# Patient Record
Sex: Female | Born: 2000 | Hispanic: Yes | Marital: Single | Smoking: Never smoker
Health system: Southern US, Community
[De-identification: ages and names within clinical notes are randomized; demographics above are authoritative.]

## PROBLEM LIST (undated history)

## (undated) DIAGNOSIS — D649 Anemia, unspecified: Secondary | ICD-10-CM

## (undated) DIAGNOSIS — E308 Other disorders of puberty: Secondary | ICD-10-CM

## (undated) HISTORY — DX: Other disorders of puberty: E30.8

## (undated) HISTORY — DX: Anemia, unspecified: D64.9

---
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
## 2021-01-31 ENCOUNTER — Ambulatory Visit (INDEPENDENT_AMBULATORY_CARE_PROVIDER_SITE_OTHER): Payer: Medicaid Other | Admitting: Pediatrics

## 2021-01-31 ENCOUNTER — Encounter: Payer: Self-pay | Admitting: Pediatrics

## 2021-01-31 ENCOUNTER — Other Ambulatory Visit: Payer: Self-pay

## 2021-01-31 VITALS — Wt 106.5 lb

## 2021-01-31 DIAGNOSIS — R253 Fasciculation: Secondary | ICD-10-CM

## 2021-01-31 DIAGNOSIS — M545 Low back pain, unspecified: Secondary | ICD-10-CM

## 2021-01-31 DIAGNOSIS — R42 Dizziness and giddiness: Secondary | ICD-10-CM | POA: Diagnosis not present

## 2021-01-31 DIAGNOSIS — R5382 Chronic fatigue, unspecified: Secondary | ICD-10-CM

## 2021-01-31 LAB — POCT URINALYSIS DIPSTICK
Bilirubin, UA: NEGATIVE
Blood, UA: 250
Glucose, UA: NEGATIVE
Ketones, UA: NEGATIVE
Nitrite, UA: NEGATIVE
Protein, UA: NEGATIVE
Spec Grav, UA: 1.01 (ref 1.010–1.025)
Urobilinogen, UA: NEGATIVE E.U./dL — AB
pH, UA: 8 — AB (ref 5.0–8.0)

## 2021-01-31 NOTE — Progress Notes (Signed)
Subjective:     History was provided by the patient. Miranda Steele is a 20 y.o. female here for evaluation of the following concerns that started approximately 3 weeks ago: 1) very dehydrated, "no matter how much I drink, I'm still thirsty" 2) very tired all the time 3) will wake up at 0500 feeling very hot 4) hands and muscles will twitch 5) increased headache frequency over the past few weeks  -remembers 3, two of which were mild  -3rd headache was "intense"- vision changes, felt weak, nauseated  6) gets lightheaded 7) has had no appetite- will eat a few snacks 8) intermittent right sided back pain and right upper abdominal pain  No fevers. No vomiting or diarrhea. Intermittent sore throat. There has been a 3lb weight loss since last office visit (02/29/2020). Mother has diabetes and is concerned symptoms are related to diabetes.   Common ambulatory SmartLinks:[id number]}  Review of Systems Pertinent items are noted in HPI   Objective:    Wt 106 lb 8 oz (48.3 kg)  General:   alert, cooperative, appears stated age, fatigued and no distress  HEENT:   right and left TM normal without fluid or infection, neck without nodes, throat normal without erythema or exudate, airway not compromised and no evidence of ectopic thyroid  Neck:  no adenopathy, no carotid bruit, no JVD, supple, symmetrical, trachea midline and thyroid not enlarged, symmetric, no tenderness/mass/nodules.  Lungs:  clear to auscultation bilaterally  Heart:  regular rate and rhythm, S1, S2 normal, no murmur, click, rub or gallop  Abdomen:   normal findings: bowel sounds normal and soft and mild tenderness in right upper quadrant without organomegaly, NO rebound tenderness  Skin:   reveals no rash     Extremities:   extremities normal, atraumatic, no cyanosis or edema     Neurological:  alert, oriented x 3, no defects noted in general exam.    Results for orders placed or performed in visit on 01/31/21 (from the past  24 hour(s))  POCT urinalysis dipstick     Status: Abnormal   Collection Time: 01/31/21 11:09 AM  Result Value Ref Range   Color, UA amber    Clarity, UA clear    Glucose, UA Negative Negative   Bilirubin, UA negative    Ketones, UA negative    Spec Grav, UA 1.010 1.010 - 1.025   Blood, UA 250    pH, UA 8.0 (A) 5.0 - 8.0   Protein, UA Negative Negative   Urobilinogen, UA negative (A) 0.2 or 1.0 E.U./dL   Nitrite, UA negative    Leukocytes, UA Trace (A) Negative   Appearance     Odor      Assessment:   Fatigue Dizzy spells Right sided back and abdominal pain Muscle twitching  Plan:   Blood in urine contributed to Terrisha currently on her period, otherwise urine WNL UCX pending, will call if culture positive and start antibiotics Labs per orders (CBC, CMET, ESR, Vitamin D, TSH, Free T4, EBV, Hgb A1C) Will call patient with lab results [phone]  20 minutes spent in direct face to face time with patient discussing HPI, labs, and follow up plan.  

## 2021-01-31 NOTE — Patient Instructions (Signed)
Urine looks good in the office, will send culture to lab Blood work ordered, will call with results Continue to drink plenty of fluids, eat small snacks throughout the day Try to get at least 8 hours of sleep as much as possible Turn screens (TV, cell phone, tablets, laptops) off an hour before bed as much as possible

## 2021-02-01 ENCOUNTER — Telehealth: Payer: Self-pay | Admitting: Pediatrics

## 2021-02-01 DIAGNOSIS — R253 Fasciculation: Secondary | ICD-10-CM | POA: Diagnosis not present

## 2021-02-01 DIAGNOSIS — R42 Dizziness and giddiness: Secondary | ICD-10-CM | POA: Diagnosis not present

## 2021-02-01 DIAGNOSIS — R5382 Chronic fatigue, unspecified: Secondary | ICD-10-CM | POA: Diagnosis not present

## 2021-02-01 LAB — HEMOGLOBIN A1C
Hgb A1c MFr Bld: 5 % of total Hgb (ref <5.7)
Mean Plasma Glucose: 97 mg/dL
eAG (mmol/L): 5.4 mmol/L

## 2021-02-01 LAB — CBC WITH DIFFERENTIAL/PLATELET
Absolute Monocytes: 462 cells/uL (ref 200–900)
Basophils Absolute: 27 cells/uL (ref 0–200)
Basophils Relative: 0.4 %
Eosinophils Absolute: 61 cells/uL (ref 15–500)
Eosinophils Relative: 0.9 %
HCT: 41.5 % (ref 34.0–46.0)
Hemoglobin: 13.9 g/dL (ref 11.5–15.3)
Lymphs Abs: 1870 cells/uL (ref 1200–5200)
MCH: 31 pg (ref 25.0–35.0)
MCHC: 33.5 g/dL (ref 31.0–36.0)
MCV: 92.4 fL (ref 78.0–98.0)
MPV: 9.2 fL (ref 7.5–12.5)
Monocytes Relative: 6.8 %
Neutro Abs: 4379 cells/uL (ref 1800–8000)
Neutrophils Relative %: 64.4 %
Platelets: 306 10*3/uL (ref 140–400)
RBC: 4.49 10*6/uL (ref 3.80–5.10)
RDW: 12.1 % (ref 11.0–15.0)
Total Lymphocyte: 27.5 %
WBC: 6.8 10*3/uL (ref 4.5–13.0)

## 2021-02-01 LAB — COMPREHENSIVE METABOLIC PANEL
AG Ratio: 1.5 (calc) (ref 1.0–2.5)
ALT: 12 U/L (ref 5–32)
AST: 13 U/L (ref 12–32)
Albumin: 4.4 g/dL (ref 3.6–5.1)
Alkaline phosphatase (APISO): 55 U/L (ref 36–128)
BUN: 14 mg/dL (ref 7–20)
CO2: 24 mmol/L (ref 20–32)
Calcium: 9.5 mg/dL (ref 8.9–10.4)
Chloride: 105 mmol/L (ref 98–110)
Creat: 0.79 mg/dL (ref 0.50–1.00)
Globulin: 2.9 g/dL (calc) (ref 2.0–3.8)
Glucose, Bld: 98 mg/dL (ref 65–139)
Potassium: 4 mmol/L (ref 3.8–5.1)
Sodium: 139 mmol/L (ref 135–146)
Total Bilirubin: 0.5 mg/dL (ref 0.2–1.1)
Total Protein: 7.3 g/dL (ref 6.3–8.2)

## 2021-02-01 LAB — URINE CULTURE
MICRO NUMBER:: 11792420
Result:: NO GROWTH
SPECIMEN QUALITY:: ADEQUATE

## 2021-02-01 LAB — SEDIMENTATION RATE: Sed Rate: 6 mm/h (ref 0–20)

## 2021-02-01 LAB — TSH+FREE T4: TSH W/REFLEX TO FT4: 0.89 mIU/L

## 2021-02-01 LAB — EPSTEIN-BARR VIRUS EARLY D ANTIGEN ANTIBODY, IGG: EBV EA IgG: 12 U/mL — ABNORMAL HIGH

## 2021-02-01 LAB — EPSTEIN-BARR VIRUS VCA, IGM: EBV VCA IgM: <36 U/mL

## 2021-02-01 LAB — VITAMIN D 25 HYDROXY (VIT D DEFICIENCY, FRACTURES): Vit D, 25-Hydroxy: 21 ng/mL — ABNORMAL LOW (ref 30–100)

## 2021-02-01 LAB — EPSTEIN-BARR VIRUS NUCLEAR ANTIGEN ANTIBODY, IGG: EBV NA IgG: 31 U/mL — ABNORMAL HIGH

## 2021-02-01 LAB — EPSTEIN-BARR VIRUS VCA, IGG: EBV VCA IgG: >750 U/mL — ABNORMAL HIGH

## 2021-02-01 MED ORDER — VITAMIN D (ERGOCALCIFEROL) 50 MCG (2000 UT) PO CAPS: 1.0000 | ORAL_CAPSULE | Freq: Every day | ORAL | 6 refills | Status: DC

## 2021-02-01 NOTE — Telephone Encounter (Signed)
Reviewed lab results with mother. Vitamin D levels are low, will start Verlon Au on daily vitamin D supplement. Mom confirmed pharmacy, verbalized understanding and agreement.

## 2021-02-02 LAB — URINE CULTURE
MICRO NUMBER:: 11798909
SPECIMEN QUALITY:: ADEQUATE

## 2021-04-11 DIAGNOSIS — Z30013 Encounter for initial prescription of injectable contraceptive: Secondary | ICD-10-CM | POA: Diagnosis not present

## 2021-04-11 DIAGNOSIS — Z3202 Encounter for pregnancy test, result negative: Secondary | ICD-10-CM | POA: Diagnosis not present

## 2021-06-20 DIAGNOSIS — Z3042 Encounter for surveillance of injectable contraceptive: Secondary | ICD-10-CM | POA: Diagnosis not present

## 2021-08-29 DIAGNOSIS — Z3042 Encounter for surveillance of injectable contraceptive: Secondary | ICD-10-CM | POA: Diagnosis not present

## 2021-08-30 ENCOUNTER — Other Ambulatory Visit: Payer: Self-pay

## 2021-08-30 ENCOUNTER — Ambulatory Visit (INDEPENDENT_AMBULATORY_CARE_PROVIDER_SITE_OTHER): Payer: Medicaid Other | Admitting: Pediatrics

## 2021-08-30 VITALS — Wt 106.3 lb

## 2021-08-30 DIAGNOSIS — N3001 Acute cystitis with hematuria: Secondary | ICD-10-CM | POA: Diagnosis not present

## 2021-08-30 DIAGNOSIS — R300 Dysuria: Secondary | ICD-10-CM | POA: Diagnosis not present

## 2021-08-30 LAB — POCT URINALYSIS DIPSTICK
Bilirubin, UA: NEGATIVE
Blood, UA: NEGATIVE
Glucose, UA: NEGATIVE
Ketones, UA: NEGATIVE
Leukocytes, UA: NEGATIVE
Nitrite, UA: NEGATIVE
Protein, UA: NEGATIVE
Spec Grav, UA: 1.02 (ref 1.010–1.025)
Urobilinogen, UA: NEGATIVE E.U./dL — AB
pH, UA: 7 (ref 5.0–8.0)

## 2021-08-30 MED ORDER — CEPHALEXIN 500 MG PO CAPS: 500.0000 mg | ORAL_CAPSULE | Freq: Two times a day (BID) | ORAL | 0 refills | Status: AC

## 2021-08-30 MED ORDER — POLYETHYLENE GLYCOL 3350 17 G PO PACK: 17.0000 g | PACK | Freq: Every day | ORAL | 3 refills | Status: AC

## 2021-08-31 LAB — URINE CULTURE
MICRO NUMBER:: 12651796
SPECIMEN QUALITY:: ADEQUATE

## 2021-09-02 ENCOUNTER — Encounter: Payer: Self-pay | Admitting: Pediatrics

## 2021-09-02 DIAGNOSIS — N3001 Acute cystitis with hematuria: Secondary | ICD-10-CM | POA: Insufficient documentation

## 2021-09-02 DIAGNOSIS — R300 Dysuria: Secondary | ICD-10-CM | POA: Insufficient documentation

## 2021-09-02 NOTE — Progress Notes (Signed)
  Subjective:   20 yo female who complains of abnormal smelling urine, burning with urination and frequency. She has had symptoms for 2 days. Patient also complains of fever and sorethroat. Patient denies cough.   The following portions of the patient's history were reviewed and updated as appropriate: allergies, current medications, past family history, past medical history, past social history, past surgical history and problem list.  Review of Systems Pertinent items are noted in HPI.    Objective:    General appearance: cooperative Ears: normal TM's and external ear canals both ears Nose: Nares normal. Septum midline. Mucosa normal. No drainage or sinus tenderness. Throat: lips, mucosa, and tongue normal; teeth and gums normal Lungs: clear to auscultation bilaterally Heart: regular rate and rhythm, S1, S2 normal, no murmur, click, rub or gallop Abdomen: soft, non-tender; bowel sounds normal; no masses,  no organomegaly Skin: normal   Laboratory:  Urine dipstick: sp gravity 1020, negative for glucose, 1+ for hemoglobin, negative for ketones, 2+ for leukocyte esterase, 1+ for nitrites, trace for protein and trace for urobilinogen.   Micro exam: not done.    Assessment:    UTI  likely   Plan:    Medications: sent  Maintain adequate hydration. Follow up if symptoms not improving, and as needed.

## 2021-09-02 NOTE — Patient Instructions (Signed)
Urinary Tract Infection, Adult A urinary tract infection (UTI) is an infection of any part of the urinary tract. The urinary tract includes the kidneys, ureters, bladder, and urethra. These organs make, store, and get rid of urine in the body. An upper UTI affects the ureters and kidneys. A lower UTI affects the bladder and urethra. What are the causes? Most urinary tract infections are caused by bacteria in your genital area around your urethra, where urine leaves your body. These bacteria grow and cause inflammation of your urinary tract. What increases the risk? You are more likely to develop this condition if: You have a urinary catheter that stays in place. You are not able to control when you urinate or have a bowel movement (incontinence). You are female and you: Use a spermicide or diaphragm for birth control. Have low estrogen levels. Are pregnant. You have certain genes that increase your risk. You are sexually active. You take antibiotic medicines. You have a condition that causes your flow of urine to slow down, such as: An enlarged prostate, if you are female. Blockage in your urethra. A kidney stone. A nerve condition that affects your bladder control (neurogenic bladder). Not getting enough to drink, or not urinating often. You have certain medical conditions, such as: Diabetes. A weak disease-fighting system (immunesystem). Sickle cell disease. Gout. Spinal cord injury. What are the signs or symptoms? Symptoms of this condition include: Needing to urinate right away (urgency). Frequent urination. This may include small amounts of urine each time you urinate. Pain or burning with urination. Blood in the urine. Urine that smells bad or unusual. Trouble urinating. Cloudy urine. Vaginal discharge, if you are female. Pain in the abdomen or the lower back. You may also have: Vomiting or a decreased appetite. Confusion. Irritability or tiredness. A fever or  chills. Diarrhea. The first symptom in older adults may be confusion. In some cases, they may not have any symptoms until the infection has worsened. How is this diagnosed? This condition is diagnosed based on your medical history and a physical exam. You may also have other tests, including: Urine tests. Blood tests. Tests for STIs (sexually transmitted infections). If you have had more than one UTI, a cystoscopy or imaging studies may be done to determine the cause of the infections. How is this treated? Treatment for this condition includes: Antibiotic medicine. Over-the-counter medicines to treat discomfort. Drinking enough water to stay hydrated. If you have frequent infections or have other conditions such as a kidney stone, you may need to see a health care provider who specializes in the urinary tract (urologist). In rare cases, urinary tract infections can cause sepsis. Sepsis is a life-threatening condition that occurs when the body responds to an infection. Sepsis is treated in the hospital with IV antibiotics, fluids, and other medicines. Follow these instructions at home: Medicines Take over-the-counter and prescription medicines only as told by your health care provider. If you were prescribed an antibiotic medicine, take it as told by your health care provider. Do not stop using the antibiotic even if you start to feel better. General instructions Make sure you: Empty your bladder often and completely. Do not hold urine for long periods of time. Empty your bladder after sex. Wipe from front to back after urinating or having a bowel movement if you are female. Use each tissue only one time when you wipe. Drink enough fluid to keep your urine pale yellow. Keep all follow-up visits. This is important. Contact a health care provider   if: Your symptoms do not get better after 1-2 days. Your symptoms go away and then return. Get help right away if: You have severe pain in your  back or your lower abdomen. You have a fever or chills. You have nausea or vomiting. Summary A urinary tract infection (UTI) is an infection of any part of the urinary tract, which includes the kidneys, ureters, bladder, and urethra. Most urinary tract infections are caused by bacteria in your genital area. Treatment for this condition often includes antibiotic medicines. If you were prescribed an antibiotic medicine, take it as told by your health care provider. Do not stop using the antibiotic even if you start to feel better. Keep all follow-up visits. This is important. This information is not intended to replace advice given to you by your health care provider. Make sure you discuss any questions you have with your health care provider. Document Revised: 05/12/2020 Document Reviewed: 05/12/2020 Elsevier Patient Education  2022 Elsevier Inc.  

## 2021-11-13 DIAGNOSIS — Z3042 Encounter for surveillance of injectable contraceptive: Secondary | ICD-10-CM | POA: Diagnosis not present

## 2022-01-22 DIAGNOSIS — R350 Frequency of micturition: Secondary | ICD-10-CM | POA: Diagnosis not present

## 2022-01-22 DIAGNOSIS — Z3042 Encounter for surveillance of injectable contraceptive: Secondary | ICD-10-CM | POA: Diagnosis not present

## 2022-01-22 DIAGNOSIS — Z113 Encounter for screening for infections with a predominantly sexual mode of transmission: Secondary | ICD-10-CM | POA: Diagnosis not present

## 2022-04-01 ENCOUNTER — Telehealth: Payer: Self-pay | Admitting: Pediatrics

## 2022-04-01 NOTE — Telephone Encounter (Signed)
Immunization form put in Dr.Ram's office for completion.   Will e-mail back when completed.

## 2022-04-02 DIAGNOSIS — Z3042 Encounter for surveillance of injectable contraceptive: Secondary | ICD-10-CM | POA: Diagnosis not present

## 2022-04-03 NOTE — Telephone Encounter (Signed)
Child medical report filled  

## 2022-05-27 ENCOUNTER — Encounter: Payer: Self-pay | Admitting: Pediatrics

## 2022-06-14 DIAGNOSIS — Z3042 Encounter for surveillance of injectable contraceptive: Secondary | ICD-10-CM | POA: Diagnosis not present

## 2022-08-30 ENCOUNTER — Ambulatory Visit (INDEPENDENT_AMBULATORY_CARE_PROVIDER_SITE_OTHER): Payer: Medicaid Other | Admitting: Radiology

## 2022-08-30 ENCOUNTER — Encounter: Payer: Self-pay | Admitting: Radiology

## 2022-08-30 VITALS — BP 110/62 | Ht 62.5 in | Wt 112.0 lb

## 2022-08-30 DIAGNOSIS — Z01419 Encounter for gynecological examination (general) (routine) without abnormal findings: Secondary | ICD-10-CM

## 2022-08-30 DIAGNOSIS — Z23 Encounter for immunization: Secondary | ICD-10-CM | POA: Diagnosis not present

## 2022-08-30 DIAGNOSIS — Z308 Encounter for other contraceptive management: Secondary | ICD-10-CM

## 2022-08-30 DIAGNOSIS — N760 Acute vaginitis: Secondary | ICD-10-CM

## 2022-08-30 NOTE — Progress Notes (Signed)
   Miranda Steele 2001/02/21 [id number]   History:  21 y.o. G0 presents for annual exam as a new patient. Was on depo, due last week for injection, would like a LARC instead. C/o increased vaginal discharge.  Gynecologic History No LMP recorded. Patient has had an injection. Period Pattern: (!) Irregular Menstrual Flow: Heavy (heavy 1st day) Menstrual Control: Maxi pad Dysmenorrhea: (!) Moderate Dysmenorrhea Symptoms: Cramping Contraception/Family planning: condoms and Depo-Provera injections Sexually active: yes   Obstetric History OB History  Gravida Para Term Preterm AB Living  0 0 0 0 0 0  SAB IAB Ectopic Multiple Live Births  0 0 0 0 0     The following portions of the patient's history were reviewed and updated as appropriate: allergies, current medications, past family history, past medical history, past social history, past surgical history, and problem list.  Review of Systems Pertinent items noted in HPI and remainder of comprehensive ROS otherwise negative.   Past medical history, past surgical history, family history and social history were all reviewed and documented in the EPIC chart.   Exam:  Vitals:   08/30/22 1337  BP: 110/62  Weight: 112 lb (50.8 kg)  Height: 5' 2.5" (1.588 m)   Body mass index is 20.16 kg/m.  General appearance:  Normal Thyroid:  Symmetrical, normal in size, without palpable masses or nodularity. Respiratory  Auscultation:  Clear without wheezing or rhonchi Cardiovascular  Auscultation:  Regular rate, without rubs, murmurs or gallops  Edema/varicosities:  Not grossly evident Abdominal  Soft,nontender, without masses, guarding or rebound.  Liver/spleen:  No organomegaly noted  Hernia:  None appreciated  Skin  Inspection:  Grossly normal Breasts: Examined lying and sitting.   Right: Without masses, retractions, nipple discharge or axillary adenopathy.   Left: Without masses, retractions, nipple discharge or axillary  adenopathy. Genitourinary   Inguinal/mons:  Normal without inguinal adenopathy  External genitalia:  Normal appearing vulva with no masses, tenderness, or lesions  BUS/Urethra/Skene's glands:  Normal without masses or exudate  Vagina:  Normal appearing with normal color and discharge, no lesions  Cervix:  Normal appearing without discharge or lesions  Uterus:  Normal in size, shape and contour.  Mobile, nontender  Adnexa/parametria:     Rt: Normal in size, without masses or tenderness.   Lt: Normal in size, without masses or tenderness.  Anus and perineum: Normal   Patient informed chaperone available to be present for breast and pelvic exam. Patient has requested no chaperone to be present. Patient has been advised what will be completed during breast and pelvic exam.   Assessment/Plan:   1. Well woman exam with routine gynecological exam Pap at 22  2. Encounter for other contraceptive management Desires Nexplanon No unprotected sex until insertion - Remove and insert drug implant; Future  3. Acute vaginitis - SureSwab Advanced Vaginitis Plus,TMA  4. Need for immunization against influenza - Flu Vaccine QUAD 56mo+IM (Fluarix, Fluzone & Alfiuria Quad PF)    Discussed SBE, colonoscopy and DEXA screening as directed/appropriate. Recommend of exercise weekly, including weight bearing exercise. Encouraged the use of seatbelts and sunscreen. Return in 1 year for annual or as needed.   Brave Dack B WHNP-BC 2:10 PM 08/30/2022

## 2022-08-31 LAB — SURESWAB® ADVANCED VAGINITIS PLUS,TMA
C. trachomatis RNA, TMA: NOT DETECTED
CANDIDA SPECIES: NOT DETECTED
Candida glabrata: NOT DETECTED
N. gonorrhoeae RNA, TMA: NOT DETECTED
SURESWAB(R) ADV BACTERIAL VAGINOSIS(BV),TMA: NEGATIVE
TRICHOMONAS VAGINALIS (TV),TMA: NOT DETECTED

## 2022-09-02 ENCOUNTER — Encounter: Payer: Medicaid Other | Admitting: Radiology

## 2022-09-03 ENCOUNTER — Other Ambulatory Visit: Payer: Self-pay

## 2022-09-03 DIAGNOSIS — Z308 Encounter for other contraceptive management: Secondary | ICD-10-CM

## 2022-09-10 ENCOUNTER — Ambulatory Visit (INDEPENDENT_AMBULATORY_CARE_PROVIDER_SITE_OTHER): Payer: Medicaid Other | Admitting: Radiology

## 2022-09-10 ENCOUNTER — Encounter: Payer: Self-pay | Admitting: Radiology

## 2022-09-10 VITALS — BP 108/62

## 2022-09-10 DIAGNOSIS — Z01812 Encounter for preprocedural laboratory examination: Secondary | ICD-10-CM

## 2022-09-10 DIAGNOSIS — Z308 Encounter for other contraceptive management: Secondary | ICD-10-CM

## 2022-09-10 DIAGNOSIS — Z30017 Encounter for initial prescription of implantable subdermal contraceptive: Secondary | ICD-10-CM

## 2022-09-10 LAB — PREGNANCY, URINE: Preg Test, Ur: NEGATIVE

## 2022-09-10 NOTE — Progress Notes (Signed)
     21 y.o. G0P0000 female presents for Nexplanon insertion.  She has been counseled about alternative types of contraception and has decided this long acting method is the best for her.  Procedure, risks and benefits have all been explained.    Pregnancy test: negative  After all questions were answered, consent was obtained.    Past Medical History:  Diagnosis Date   Anemia    [place], premature     No past surgical history on file.  Current Outpatient Medications on File Prior to Visit  Medication Sig Dispense Refill   naproxen (NAPROSYN) 500 MG tablet TAKE 1 TABLET BY MOUTH TWICE DAILY AS NEEDED 60 tablet 0   No current facility-administered medications on file prior to visit.   No Known Allergies  Vitals:   09/10/22 0926  BP: 108/62   Physical Exam  Procedure: Patient placed supine on exam table with her left arm flexed at the elbow. The location for insertion site was identified 8-10 cm from epicondyle and 3-5 cm posterior to the sulcus.  In sterile fashion the area was cleansed with Betadine x 3. Insertion site and path of insertion was anesthetized with 1% Lidocaine without epinephrine, 2 cc total.  Using Nexplanon device (and after confirming presence of rod in device), skin was pierced and then elevated along insertion path, passing device just under the skin.  Rod released and device inserted.  Rod palpated easily by myself and the patient.  Band aid and pressure bandage were applied over the site.   Chaperone, Raynelle Fanning, CMA was present for the entirety of the procedure  Assessment: Nexplanon insertion.  Plan:  Post procedure instructions reviewed with pt.  Questions answered.  Pt knows to call with any concerns or questions.  Pt is aware removal is due by 3 calendar years from today.

## 2022-10-25 ENCOUNTER — Ambulatory Visit (INDEPENDENT_AMBULATORY_CARE_PROVIDER_SITE_OTHER): Payer: Medicaid Other | Admitting: Pediatrics

## 2022-10-25 ENCOUNTER — Encounter: Payer: Self-pay | Admitting: Pediatrics

## 2022-10-25 VITALS — Temp 98.2°F | Wt 114.6 lb

## 2022-10-25 DIAGNOSIS — R5381 Other malaise: Secondary | ICD-10-CM | POA: Diagnosis not present

## 2022-10-25 DIAGNOSIS — R110 Nausea: Secondary | ICD-10-CM | POA: Diagnosis not present

## 2022-10-25 DIAGNOSIS — R5383 Other fatigue: Secondary | ICD-10-CM

## 2022-10-25 DIAGNOSIS — G44219 Episodic tension-type headache, not intractable: Secondary | ICD-10-CM

## 2022-10-25 DIAGNOSIS — R300 Dysuria: Secondary | ICD-10-CM | POA: Diagnosis not present

## 2022-10-25 LAB — POCT URINALYSIS DIPSTICK
Bilirubin, UA: NORMAL
Blood, UA: NORMAL
Glucose, UA: POSITIVE — AB
Leukocytes, UA: NEGATIVE
Nitrite, UA: NORMAL
Protein, UA: POSITIVE — AB
Spec Grav, UA: 1.02 (ref 1.010–1.025)
Urobilinogen, UA: 4 U/dL — AB
pH, UA: 5 (ref 5.0–8.0)

## 2022-10-25 LAB — POCT INFLUENZA A: Rapid Influenza A Ag: NEGATIVE

## 2022-10-25 LAB — POCT INFLUENZA B: Rapid Influenza B Ag: NEGATIVE

## 2022-10-25 LAB — POCT GLUCOSE (DEVICE FOR HOME USE): POC Glucose: 102 mg/dl — AB (ref 70–99)

## 2022-10-25 LAB — POC SOFIA SARS ANTIGEN FIA: SARS Coronavirus 2 Ag: NEGATIVE

## 2022-10-25 MED ORDER — ONDANSETRON HCL 4 MG PO TABS: 4.0000 mg | ORAL_TABLET | Freq: Three times a day (TID) | ORAL | 0 refills | Status: AC | PRN

## 2022-10-25 MED ORDER — HYDROXYZINE HCL 10 MG PO TABS: 10.0000 mg | ORAL_TABLET | Freq: Three times a day (TID) | ORAL | 0 refills | Status: AC | PRN

## 2022-10-25 NOTE — Progress Notes (Unsigned)
Yesterday afternoon  Goes to [facility] a nap yesterday afternoon Started to feel very weak, shaky Woke up and felt worse Bad headache Extreme weakness Felt faint yesterday Intense headache Headache through the night Feels a little better Feels dehydrated Having cramping in the legs for the last 2 days Chills  Hands and feet cold No medication No blurry vision Does bruise easily Feeling like heart is racing Felt fatigued yesterday while walking Still taking iron and has taken Vitamin D before, but not currently  Some burning with urination  No low back pain Has Nexplanon- doesn't have cycle very often  Just inserted it last month Was on last month Diarrhea in the last day, no vomiting  Subjective:      History was provided by the {relatives:19415}.  Miranda Steele is a 22 y.o. female here for chief complaint of *** .  {Common ambulatory SmartLinks:19316}  Review of Systems All pertinent information noted in the HPI.  Objective:  Temp 98.2 F (36.8 C)   Wt 114 lb 9.6 oz (52 kg)   BMI 20.63 kg/m  General:   {appearance:16600}  Oropharynx:  {oropharynx brief exam:17160::"lips, mucosa, and tongue normal; teeth and gums normal"}   Eyes:   {eyes:201::"conjunctivae/corneas clear. PERRL, EOM's intact. Fundi benign."}   Ears:   {ears:5207::"normal TM's and external ear canals both ears"}  Neck:  {neck:17463::"no adenopathy","no carotid bruit","no JVD","supple, symmetrical, trachea midline","thyroid not enlarged, symmetric, no tenderness/mass/nodules"}  Thyroid:   {nodule:16229}  Lung:  {lung exam:16931}  Heart:   {heart:5510}  Abdomen:  {abdomen exam:16834}  Extremities:  {extremity:5109}  Skin:  {skin:17778::"warm and dry, no hyperpigmentation, vitiligo, or suspicious lesions"}  Neurological:   {neuro exam:16942}  Psychiatric:   {psych:16943::"normal mood, behavior, speech, dress, and thought processes"}   Results for orders placed or performed in  visit on 10/25/22 (from the past 24 hour(s))  POCT Influenza A     Status: Normal   Collection Time: 10/25/22 10:04 AM  Result Value Ref Range   Rapid Influenza A Ag Negative   POCT Influenza B     Status: Normal   Collection Time: 10/25/22 10:05 AM  Result Value Ref Range   Rapid Influenza B Ag Negative   POC SOFIA Antigen FIA     Status: Normal   Collection Time: 10/25/22 10:05 AM  Result Value Ref Range   SARS Coronavirus 2 Ag Negative Negative  POCT Glucose (Device for Home Use)     Status: Abnormal   Collection Time: 10/25/22 10:33 AM  Result Value Ref Range   Glucose Fasting, POC     POC Glucose 102 (A) 70 - 99 mg/dl  POCT urinalysis dipstick     Status: Abnormal   Collection Time: 10/25/22 10:33 AM  Result Value Ref Range   Color, UA Yellow    Clarity, UA Clear    Glucose, UA Positive (A) Negative   Bilirubin, UA Normal    Ketones, UA Normail    Spec Grav, UA 1.020 1.010 - 1.025   Blood, UA Normal    pH, UA 5.0 5.0 - 8.0   Protein, UA Positive (A) Negative   Urobilinogen, UA 4.0 (A) 0.2 or 1.0 E.U./dL   Nitrite, UA Normal    Leukocytes, UA Negative Negative   Appearance Clear    Odor None    Assessment:     Plan:  Normal progression of disease discussed. All questions answered. Explained the rationale for symptomatic treatment rather than use of an antibiotic. Instruction provided in  the use of fluids, vaporizer, acetaminophen, and other OTC medication for symptom control. Extra fluids Analgesics as needed, dose reviewed. Follow up as needed should symptoms fail to improve.   -Return precautions discussed. No follow-ups on file.  Meds ordered this encounter  Medications   hydrOXYzine (ATARAX) 10 MG tablet    Sig: Take 1 tablet (10 mg total) by mouth every 8 (eight) hours as needed for up to 5 days.    Dispense:  15 tablet    Refill:  0    Order Specific Question:   Supervising Provider    Answer:   [name] [[id number]]   ondansetron (ZOFRAN) 4 MG  tablet    Sig: Take 1 tablet (4 mg total) by mouth every 8 (eight) hours as needed for up to 5 days for nausea or vomiting.    Dispense:  15 tablet    Refill:  0    Order Specific Question:   Supervising Provider    Answer:   [name] [[id number]]     Arville Care, NP  10/25/22

## 2022-10-25 NOTE — Patient Instructions (Addendum)
Migraine "cocktail" to help with the headache and nausea: 1 Tablet Hydroxyzine  1 tablet Zofran 1 dose of Tylenol/Motrin/Naproxen   Take all 3 meds together. Increase fluid intake  Fatiga Fatigue Si siente fatiga, tiene una sensacin de cansancio en todo momento y falta de energa o falta de motivacin. La fatiga puede dificultar el inicio o la realizacin de tareas debido al agotamiento. La fatiga ocasional o leve con frecuencia es una reaccin normal a la actividad o la vida. Sin embargo, la fatiga de Engineer, site duracin (crnica) o extrema puede ser sntoma de una afeccin mdica, por ejemplo: Depresin. No tener suficientes glbulos rojos o hemoglobina en la sangre (anemia). Un problema con Ardelia Mems glndula pequea ubicada en la parte inferior delantera del cuello (trastorno tiroideo). Afecciones reumatolgicas. Estos son problemas relacionados con el sistema de defensa del cuerpo (sistema inmunitario). Infecciones, especialmente ciertas infecciones virales. Con el transcurso del [place], la fatiga tambin puede tener consecuencias negativas en la salud. Siga estas instrucciones en su casa: Medicamentos Use los medicamentos de venta libre y los recetados solamente como se lo haya indicado el mdico. Tome una multivitamina, si se lo indic el mdico. No tome ningn complemento alimenticio o a base de hierbas a menos que lo haya autorizado el mdico. Comida y bebida  Evite las comidas abundantes a la noche. Consuma una dieta bien balanceada que incluya protenas magras, cereales integrales, abundantes frutas, verduras y productos lcteos descremados. Evite comer o beber demasiados productos que contengan cafena. Evite tomar alcohol. Beber suficiente lquido como para Theatre manager la orina de color amarillo plido. Actividad  Realice ejercicio con regularidad como se lo haya indicado el mdico. Practique tcnicas que lo ayuden a Nurse, children's, como yoga, tai chi, meditacin, masoterapia. [place] situaciones que le provocan estrs. Trate de que sus horarios de [place] y personal estn equilibrados. No use drogas ilegales ni recreativas. Instrucciones generales Controle su fatiga para ver si hay cambios. Acustese y levntese a la misma hora todos [place]. Evite la fatiga moderando su ritmo [place] y durmiendo lo suficiente durante la noche. Mantenga un peso saludable. Comunquese con un mdico si: La fatiga no mejora. Tiene fiebre. Pierde peso o [place] de peso de forma repentina. Tiene dolores de [place]. Tiene problemas para conciliar el sueo o para dormir en la noche. Se siente enojado, culpable, ansioso o triste. Observa hinchazn en las piernas u otras partes del cuerpo. Solicite ayuda de inmediato si: Se siente confundido, siente que va a desmayarse o se desmaya. Tiene la visin borrosa o tiene dolor de cabeza intenso. Siente dolor intenso en el abdomen, la espalda o el rea entre la cintura y las caderas (pelvis). Tiene dolor de pecho, dificultad para respirar, o latidos cardacos irregulares o acelerados. No puede orinar u [employer] de lo normal. Presenta sangrado anormal del recto, la nariz, los pulmones, los pezones o, si es [place], de la vagina. Vomita sangre. Tiene pensamientos acerca de Sport and exercise psychologist a Producer, television/film/video. Estos sntomas pueden Sales executive. Solicite ayuda de inmediato. Llame al 911. No espere a ver si los sntomas desaparecen. No conduzca por sus propios medios [employer]. [place] ayuda de inmediato si alguna vez siente que puede hacerse dao a usted mismo o a otros, o tiene pensamientos de Doctor, hospital a su vida. Dirjase al centro de urgencias ms cercano o: Llame al 911. Llame a National Suicide Prevention Lifeline ([place] para la Prevencin del Suicidio) al [phone] o al 988. Est  disponible las 24 horas del da. Enve un mensaje de texto a la lnea para casos de crisis al  [phone]. Resumen Si siente fatiga, tiene una sensacin de cansancio en todo momento y falta de energa o falta de motivacin. La fatiga puede dificultar el inicio o la realizacin de tareas debido al agotamiento. La fatiga de larga duracin (crnica) o extrema puede ser sntoma de una afeccin mdica. Realice ejercicio con regularidad como se lo haya indicado el mdico. Cambie las situaciones que le provocan estrs. Trate de que sus horarios de [place] y personal estn equilibrados. Esta informacin no tiene Marine scientist el consejo del mdico. Asegrese de hacerle al mdico cualquier pregunta que tenga. Document Revised: 08/09/2021 Document Reviewed: 08/09/2021 Elsevier Patient Education  [place].

## 2022-10-26 LAB — URINE CULTURE
MICRO NUMBER:: 14424644
Result:: NO GROWTH
SPECIMEN QUALITY:: ADEQUATE

## 2022-10-27 DIAGNOSIS — R5381 Other malaise: Secondary | ICD-10-CM | POA: Insufficient documentation

## 2022-10-27 DIAGNOSIS — G44219 Episodic tension-type headache, not intractable: Secondary | ICD-10-CM | POA: Insufficient documentation

## 2022-10-30 LAB — CBC WITH DIFFERENTIAL/PLATELET
Absolute Monocytes: 420 cells/uL (ref 200–950)
Basophils Absolute: 30 cells/uL (ref 0–200)
Basophils Relative: 0.5 %
Eosinophils Absolute: 30 cells/uL (ref 15–500)
Eosinophils Relative: 0.5 %
HCT: 43.7 % (ref 35.0–45.0)
Hemoglobin: 15.1 g/dL (ref 11.7–15.5)
Lymphs Abs: 2118 cells/uL (ref 850–3900)
MCH: 30.7 pg (ref 27.0–33.0)
MCHC: 34.6 g/dL (ref 32.0–36.0)
MCV: 88.8 fL (ref 80.0–100.0)
MPV: 9.6 fL (ref 7.5–12.5)
Monocytes Relative: 7 %
Neutro Abs: 3402 cells/uL (ref 1500–7800)
Neutrophils Relative %: 56.7 %
Platelets: 297 10*3/uL (ref 140–400)
RBC: 4.92 10*6/uL (ref 3.80–5.10)
RDW: 11.9 % (ref 11.0–15.0)
Total Lymphocyte: 35.3 %
WBC: 6 10*3/uL (ref 3.8–10.8)

## 2022-10-30 LAB — VITAMIN D 1,25 DIHYDROXY
Vitamin D 1, 25 (OH)2 Total: 40 pg/mL (ref 18–72)
Vitamin D2 1, 25 (OH)2: <8 pg/mL
Vitamin D3 1, 25 (OH)2: 40 pg/mL

## 2022-10-30 LAB — HEMOGLOBIN A1C
Hgb A1c MFr Bld: 5.1 % of total Hgb (ref <5.7)
Mean Plasma Glucose: 100 mg/dL
eAG (mmol/L): 5.5 mmol/L

## 2022-10-30 LAB — COMPREHENSIVE METABOLIC PANEL
AG Ratio: 1.5 (calc) (ref 1.0–2.5)
ALT: 16 U/L (ref 6–29)
AST: 14 U/L (ref 10–30)
Albumin: 4.7 g/dL (ref 3.6–5.1)
Alkaline phosphatase (APISO): 63 U/L (ref 31–125)
BUN: 15 mg/dL (ref 7–25)
CO2: 20 mmol/L (ref 20–32)
Calcium: 9.9 mg/dL (ref 8.6–10.2)
Chloride: 103 mmol/L (ref 98–110)
Creat: 0.82 mg/dL (ref 0.50–0.96)
Globulin: 3.2 g/dL (calc) (ref 1.9–3.7)
Glucose, Bld: 79 mg/dL (ref 65–99)
Potassium: 4 mmol/L (ref 3.5–5.3)
Sodium: 138 mmol/L (ref 135–146)
Total Bilirubin: 0.6 mg/dL (ref 0.2–1.2)
Total Protein: 7.9 g/dL (ref 6.1–8.1)

## 2022-10-30 LAB — EPSTEIN-BARR VIRUS NUCLEAR ANTIGEN ANTIBODY, IGG: EBV NA IgG: 358 U/mL — ABNORMAL HIGH

## 2022-10-30 LAB — TSH: TSH: 0.92 mIU/L

## 2022-10-30 LAB — EPSTEIN-BARR VIRUS EARLY D ANTIGEN ANTIBODY, IGG: EBV EA IgG: 15.2 U/mL — ABNORMAL HIGH (ref <9.00)

## 2022-10-30 LAB — EPSTEIN-BARR VIRUS VCA, IGM: EBV VCA IgM: <36 U/mL

## 2022-10-30 LAB — EPSTEIN-BARR VIRUS VCA, IGG: EBV VCA IgG: >750 U/mL — ABNORMAL HIGH

## 2022-10-30 LAB — T4, FREE: Free T4: 1.2 ng/dL (ref 0.8–1.4)

## 2023-06-24 ENCOUNTER — Encounter: Payer: Self-pay | Admitting: Pediatrics

## 2023-08-22 ENCOUNTER — Ambulatory Visit (INDEPENDENT_AMBULATORY_CARE_PROVIDER_SITE_OTHER): Payer: Medicaid Other | Admitting: Pediatrics

## 2023-08-22 VITALS — Temp 98.2°F | Wt 123.5 lb

## 2023-08-22 DIAGNOSIS — R300 Dysuria: Secondary | ICD-10-CM | POA: Diagnosis not present

## 2023-08-22 DIAGNOSIS — N760 Acute vaginitis: Secondary | ICD-10-CM

## 2023-08-22 LAB — POCT URINALYSIS DIPSTICK
Bilirubin, UA: NEGATIVE
Blood, UA: NEGATIVE
Glucose, UA: NEGATIVE
Ketones, UA: POSITIVE
Leukocytes, UA: NEGATIVE
Nitrite, UA: NEGATIVE
Protein, UA: POSITIVE — AB
Spec Grav, UA: <=1.005 — AB (ref 1.010–1.025)
Urobilinogen, UA: 0.2 U/dL
pH, UA: 6 (ref 5.0–8.0)

## 2023-08-22 MED ORDER — FLUCONAZOLE 150 MG PO TABS: 150.0000 mg | ORAL_TABLET | Freq: Every day | ORAL | 0 refills | Status: AC

## 2023-08-22 NOTE — Progress Notes (Signed)
  Subjective:    Zalena is a 22 y.o. old female here with her mother for Dysuria   HPI: Shinique presents with history of dysuria for about 1 week.  Student at [employer] but in back in town today.  She self reports a subjective fever yesterday evening.  She reports some discharge and itching.  Recently had her period.  History of UTI in past.  Reports some slight abd pain lower stomach.  Denies any chance for STD.  Denies any HA, sore throat, cold symptoms, body aches.      The following portions of the patient's history were reviewed and updated as appropriate: allergies, current medications, past family history, past medical history, past social history, past surgical history and problem list.  Review of Systems Pertinent items are noted in HPI.   Allergies: No Known Allergies   Current Outpatient Medications on File Prior to Visit  Medication Sig Dispense Refill   naproxen (NAPROSYN) 500 MG tablet TAKE 1 TABLET BY MOUTH TWICE DAILY AS NEEDED 60 tablet 0   No current facility-administered medications on file prior to visit.    History and Problem List: Past Medical History:  Diagnosis Date   Anemia    [place], premature         Objective:    Temp 98.2 F (36.8 C) (Temporal)   Wt 123 lb 8 oz (56 kg)   BMI 22.23 kg/m   General: alert, active, non toxic, age appropriate interaction Eye:  PERRL, EOMI, conjunctivae/sclera clear, no discharge Lungs: clear to auscultation, no wheeze, crackles or retractions, unlabored breathing Heart: RRR, Nl S1, S2, no murmurs Abd: soft, non tender, non distended, no rebound tenderness, normal BS, no organomegaly, no masses appreciated GU: deferred  Skin: no rashes Neuro: normal mental status, No focal deficits      Assessment:   Mariesha is a 22 y.o. old female with  1. Dysuria   2. Vulvovaginitis     Plan:   --UA with neg LE/Nit, trace pro.  Plan to send urine for confirmation culture and to contact back if intervention  needed.  --Denies sexual encounter but could consider testing if symptoms persist.  If urine does not show likely UTI recommend return to gynecologist for evaluation.     Meds ordered this encounter  Medications   fluconazole (DIFLUCAN) 150 MG tablet    Sig: Take 1 tablet (150 mg total) by mouth daily for 3 days.    Dispense:  3 tablet    Refill:  0    Return if symptoms worsen or fail to improve. in 2-3 days or prior for concerns  Myles Gip, DO

## 2023-08-22 NOTE — Patient Instructions (Signed)
Dysuria Dysuria is pain or discomfort during urination. The pain or discomfort may be felt in the part of the body that drains urine from the bladder (urethra) or in the surrounding tissue of the genitals. The pain may also be felt in the groin area, lower abdomen, or lower back. You may have to urinate frequently or have the sudden feeling that you have to urinate (urgency). Dysuria can affect anyone, but it is more common in females. Dysuria can be caused by many different things, including: Urinary tract infection. Kidney stones or bladder stones. Certain STIs (sexually transmitted infections), such as chlamydia. Dehydration. Inflammation of the tissues of the vagina. Use of certain medicines. Use of certain soaps or scented products that cause irritation. Follow these instructions at home: Medicines Take over-the-counter and prescription medicines only as told by your health care provider. If you were prescribed an antibiotic medicine, take it as told by your health care provider. Do not stop taking the antibiotic even if you start to feel better. Eating and drinking  Drink enough fluid to keep your urine pale yellow. Avoid caffeinated beverages, tea, and alcohol. These beverages can irritate the bladder and make dysuria worse. In males, alcohol may irritate the prostate. General instructions Watch your condition for any changes. Urinate often. Avoid holding urine for long periods of time. If you are female, you should wipe from front to back after urinating or having a bowel movement. Use each piece of toilet paper only once. Empty your bladder after sex. Keep all follow-up visits. This is important. If you had any tests done to find the cause of dysuria, it is up to you to get your test results. Ask your health care provider, or the department that is doing the test, when your results will be ready. Contact a health care provider if: You have a fever. You develop pain in your back or  sides. You have nausea or vomiting. You have blood in your urine. You are not urinating as often as you usually do. Get help right away if: Your pain is severe and not relieved with medicines. You cannot eat or drink without vomiting. You are confused. You have a rapid heartbeat while resting. You have shaking or chills. You feel extremely weak. Summary Dysuria is pain or discomfort while urinating. Many different conditions can lead to dysuria. If you have dysuria, you may have to urinate frequently or have the sudden feeling that you have to urinate (urgency). Watch your condition for any changes. Keep all follow-up visits. Make sure that you urinate often and drink enough fluid to keep your urine pale yellow. This information is not intended to replace advice given to you by your health care provider. Make sure you discuss any questions you have with your health care provider. Document Revised: 05/12/2020 Document Reviewed: 05/12/2020 Elsevier Patient Education  2024 Elsevier Inc.  

## 2023-08-23 LAB — URINE CULTURE
MICRO NUMBER:: 15705999
Result:: NO GROWTH
SPECIMEN QUALITY:: ADEQUATE

## 2023-08-31 ENCOUNTER — Encounter: Payer: Self-pay | Admitting: Pediatrics

## 2024-02-13 ENCOUNTER — Telehealth: Payer: Self-pay

## 2024-02-13 NOTE — Telephone Encounter (Signed)
 Enter in error

## 2024-03-03 ENCOUNTER — Telehealth: Admitting: Psychology

## 2024-03-03 DIAGNOSIS — F411 Generalized anxiety disorder: Secondary | ICD-10-CM | POA: Diagnosis not present

## 2024-03-10 DIAGNOSIS — F411 Generalized anxiety disorder: Secondary | ICD-10-CM | POA: Diagnosis not present

## 2024-03-18 ENCOUNTER — Telehealth (INDEPENDENT_AMBULATORY_CARE_PROVIDER_SITE_OTHER): Admitting: Psychology

## 2024-03-18 DIAGNOSIS — F411 Generalized anxiety disorder: Secondary | ICD-10-CM

## 2024-03-30 DIAGNOSIS — F411 Generalized anxiety disorder: Secondary | ICD-10-CM | POA: Diagnosis not present

## 2024-03-31 ENCOUNTER — Telehealth: Admitting: Psychology

## 2024-04-07 ENCOUNTER — Telehealth (INDEPENDENT_AMBULATORY_CARE_PROVIDER_SITE_OTHER): Admitting: Psychology

## 2024-04-07 DIAGNOSIS — F411 Generalized anxiety disorder: Secondary | ICD-10-CM | POA: Diagnosis not present

## 2024-04-15 DIAGNOSIS — F411 Generalized anxiety disorder: Secondary | ICD-10-CM | POA: Diagnosis not present

## 2024-04-20 ENCOUNTER — Telehealth: Admitting: Psychology

## 2024-04-21 ENCOUNTER — Telehealth (INDEPENDENT_AMBULATORY_CARE_PROVIDER_SITE_OTHER): Admitting: Psychology

## 2024-04-21 DIAGNOSIS — F411 Generalized anxiety disorder: Secondary | ICD-10-CM | POA: Diagnosis not present

## 2024-04-27 DIAGNOSIS — F411 Generalized anxiety disorder: Secondary | ICD-10-CM | POA: Diagnosis not present

## 2024-05-03 NOTE — Progress Notes (Signed)
 DAP note Client name: Miranda Steele  Therapist name: Camie Norris [name] Date: 03/02/2024 Time: 6:00 pm  Data: This was the first session with the client. She reported that she had wanted therapy for a while now because she found herself struggling with anxiety and felt on the verge of having panic attacks at times. She was very willing and open to share. She reported a lot about her present. She reports that she is in college, at an internship, travels often, is part of many clubs and organizations, and works. She states that she likes being busy but that this was also becoming the cause of a lot of stress, as she is realizing that there is a thing of being to busy.  Assessment: Client was overwhelmed, as evidence by how busy she is all the time. It was very observant that she was able to recognize that it was to much and that she needed to do something about it. We discussed the importance of rest and how we would work on her saying no. It was important for her to understand that this does not mean that she has failed. If she is unable to give her all to something then she would not feel good about that either and so finding a happy balance is of utmost importance.  Plan: Attend bi-weekly therapy, with a goal of attending 2 sessions per month. Client will take a moment of mindfulness daily and utilize box breathing as needed for anxious moments. Client will work on boundary setting and organization with a goal of reducing overall anxiety.

## 2024-05-05 ENCOUNTER — Telehealth: Admitting: Psychology
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
## 2024-05-11 DIAGNOSIS — F411 Generalized anxiety disorder: Secondary | ICD-10-CM | POA: Diagnosis not present

## 2024-05-12 ENCOUNTER — Other Ambulatory Visit: Admitting: Psychology

## 2024-05-12 ENCOUNTER — Telehealth (INDEPENDENT_AMBULATORY_CARE_PROVIDER_SITE_OTHER): Admitting: Psychology

## 2024-05-12 DIAGNOSIS — F411 Generalized anxiety disorder: Secondary | ICD-10-CM | POA: Diagnosis not present

## 2024-05-26 ENCOUNTER — Other Ambulatory Visit: Admitting: Psychology

## 2024-05-31 NOTE — Progress Notes (Signed)
 DAP note Client name: Miranda Steele  Therapist name: Camie Norris [name] Date: 04/07/2024 Time: 6:00 pm   Data: Client stated that she was doing well today. 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 She reports that this is the first time that she could remember saying no and while it was the right choice it had cause her a lot of anxiety.    Assessment: Client presented as calm today. While it was hard for her to say no, she knew this was for the best. It was going to be more stressful if she had stayed in it because her heart was not in it. While there was anxiety there, there was also empowerment that she was able to say no.    Plan: Attend bi-weekly therapy, with a goal of attending 2 sessions per month. Client will take a moment of mindfulness daily and utilize box breathing as needed for anxious moments. Client will work on boundary setting and organization with a goal of reducing overall anxiety.

## 2024-06-08 DIAGNOSIS — F411 Generalized anxiety disorder: Secondary | ICD-10-CM | POA: Diagnosis not present

## 2024-06-15 NOTE — Progress Notes (Signed)
 DAP note Client name: Miranda Steele  Therapist name: Miranda Steele Date: 04/21/2024 Time: 6:00 pm   Data: Client stated that she was doing well today. She reports that she is doing okay with backing out of the other club. She states that she knows that she had no choice because it was causing her so much anxiety even though she does feel bad. She reports that she does hope that saying will no will come easier as time goes on.    Assessment: Client presented as calm today. Today client was provided with worksheets to help with letting go of what cannot be controled and building confidence to say no. Client knows that she needs to get ahead of this because this causes her a lot of her anxiety and she does not want it to get worse. She is willing to put in the work to grow her confidence and enforce her boundaries.    Plan: Attend bi-weekly therapy, with a goal of attending 2 sessions per month. Client will take a moment of mindfulness daily and utilize box breathing as needed for anxious moments. Client will work on boundary setting and organization with a goal of reducing overall anxiety. Client will complete worksheets so that we can discuss next week.

## 2024-06-16 ENCOUNTER — Other Ambulatory Visit: Admitting: Psychology

## 2024-06-16 ENCOUNTER — Telehealth (INDEPENDENT_AMBULATORY_CARE_PROVIDER_SITE_OTHER): Admitting: Psychology

## 2024-06-16 DIAGNOSIS — F411 Generalized anxiety disorder: Secondary | ICD-10-CM | POA: Diagnosis not present

## 2024-06-24 DIAGNOSIS — F411 Generalized anxiety disorder: Secondary | ICD-10-CM | POA: Diagnosis not present

## 2024-06-29 NOTE — Progress Notes (Signed)
 DAP note Client name: Miranda Steele  Therapist name: Camie Norris [name] Date: 05/12/2024 Time: 6:00 pm   Data: Client stated that she was doing really good. 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 She also states that she has made some connections that might come in down the road. She reports that she is really excited because she is about to take a 2 week trip to [place] and [place] with a friend.     Assessment: Client presented with a sense of accomplishment. She has worked really hard this summer and she has learned a lot. She continues to put in the work on herself and therefore is continuing to grow. She also does a really good job of self care. This trip is an example of that, she deserves to treat herself because she is always working so hard.     Plan: Attend bi-weekly therapy, with a goal of attending 2 sessions per month. Client will take a moment of mindfulness daily and utilize box breathing as needed for anxious moments. Client will work on boundary setting and organization with a goal of reducing overall anxiety. Client will complete worksheets so that we can discuss next week.

## 2024-06-30 ENCOUNTER — Ambulatory Visit (INDEPENDENT_AMBULATORY_CARE_PROVIDER_SITE_OTHER): Admitting: Psychology

## 2024-06-30 DIAGNOSIS — F411 Generalized anxiety disorder: Secondary | ICD-10-CM

## 2024-07-19 ENCOUNTER — Ambulatory Visit: Admitting: Psychology

## 2024-07-19 DIAGNOSIS — F411 Generalized anxiety disorder: Secondary | ICD-10-CM | POA: Diagnosis not present
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
## 2024-08-09 ENCOUNTER — Telehealth: Admitting: Psychology

## 2024-08-09 DIAGNOSIS — F411 Generalized anxiety disorder: Secondary | ICD-10-CM | POA: Diagnosis not present

## 2024-08-27 ENCOUNTER — Ambulatory Visit (INDEPENDENT_AMBULATORY_CARE_PROVIDER_SITE_OTHER): Admitting: Psychology

## 2024-08-27 DIAGNOSIS — F411 Generalized anxiety disorder: Secondary | ICD-10-CM | POA: Diagnosis not present

## 2024-08-30 NOTE — Progress Notes (Signed)
 DAP note Client name: Miranda Steele  Therapist name: Camie Norris [name] Date: 06/30/2024 Time: 6:00 pm   Data: Client reports that things are good. She states that she had the best time on her trip. She reports that school is going well. She states that things have not really picked up just yet and while she likes to be a bit busier she knows that she needs to enjoy the quiet while she can get it.     Assessment: Client presented as a calm presence. She was alert and oriented. Mentally she was in a good place. She is usually running around to the point that it causes her anxiety to heighten. She is trying to embrace the quiet and get ahead on some things while she can. This will help her mentally when things do get busy.   Plan: Attend bi-weekly therapy, with a goal of attending 2 sessions per month. Client will take a moment of mindfulness daily and utilize box breathing as needed for anxious moments. Client will work on boundary setting and organization with a goal of reducing overall anxiety.

## 2024-09-13 ENCOUNTER — Telehealth: Admitting: Psychology

## 2024-09-13 DIAGNOSIS — F411 Generalized anxiety disorder: Secondary | ICD-10-CM

## 2024-10-11 NOTE — Progress Notes (Signed)
 DAP note Client name: Miranda Steele  Therapist name: Camie Norris [name] Date: 07/19/2024 Time: 9:00 am   Data: Client reports that things are pretty good. She states that she is back in school and that things are going pretty well. She states that things are a little slow to start and that she is trying to embrace that because she likes to be busier. She reports that she does plan to stay present and focused because while she does like to stay busy she does not want to end up with the anxiety she had when she started therapy due to being so overwhelmed.    Assessment: Client presented as a calm presence. She was alert and oriented. She is focused on finding that that place this semester where she feels fulfilled but does not over extend. She has worked extremely hard to keep her anxiety low and she has worked hard and adapted interventions to work through it when it begins to creep up on her again,.    Plan: Attend bi-weekly therapy, with a goal of attending 2 sessions per month. Client will take a moment of mindfulness daily and utilize box breathing as needed for anxious moments. Client will work on boundary setting and organization with a goal of reducing overall anxiety.

## 2024-10-19 ENCOUNTER — Telehealth: Admitting: Psychology

## 2024-10-19 NOTE — Progress Notes (Signed)
 DAP note Client name: Miranda Steele  Therapist name: Camie Norris [name] Date: 08/09/2024 Time: 9:00 am   Data: Client reports that things are pretty good. 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 She reports that she likes it though because it motivates her. She states that she is proud because she is not feeling the horrible anxiety that she felt last semester. She states that she is just excited to keep going because she is one semester away from graduating.    Assessment: Client presented as a calm presence. She was alert and oriented. She seems to have found her happy place with her workload. She is committed to keeping that balance because she knows that is what keeps her anxiety at bay.   Plan: Attend bi-weekly therapy, with a goal of attending 2 sessions per month. Client will take a moment of mindfulness daily and utilize box breathing as needed for anxious moments. Client will work on boundary setting and organization with a goal of reducing overall anxiety.

## 2024-10-26 NOTE — Progress Notes (Signed)
"   important  Sensitive Note   DAP note Client name: Miranda Steele  Therapist name: Camie Norris [name] Date: 08/26/2024 Time: 12:00 pm   Data: Client reports that things are pretty good. She states that she is just trying to keep up with everything. She reports that she was busy but not to the point of being overwhelmed, she states that she has been able to keep up with everything nicely. She reports that as things are winding down she is just trying to make sure that she is in a good place with it all.    Assessment: Client presented as a calm presence. She was alert and oriented. She continues to keep her stress and anxiety low. She took on just enough to stay busy and challenged but she did not let it get to the point of overload. She has done a really good job of setting and maintaining her personal boundaries around this semester.    Plan: Attend monthly therapy, with a goal of attending 1 session per month. Client will take a moment of mindfulness daily and utilize box breathing as needed for anxious moments. Client will work on boundary setting and organization with a goal of reducing overall anxiety.     "

## 2024-11-08 NOTE — Progress Notes (Signed)
" °  DAP note Client name: Miranda Steele  Therapist name: Camie Norris [name] Date: 09/13/2024 Time: 11:00 am   Data: Client reports that things are okay. She states that she was able to spend some time at home for Thanksgiving. She states that it was nice to have some quality time with her family. She reports that she is wrapping up the semester. She states that she feels good about everything and is ready for the break.   Assessment: Client presented as a calm presence. She was alert and oriented. It always boosts her spirits when she is able to spend time with her family. It gives her an overall recharge and some needed downtime. She is headed back to school to wrap everything up and enter into a longer break, which will reset her for her last semester of college.     Plan: Attend monthly therapy, with a goal of attending 1 session per month. Client will take a moment of mindfulness daily and utilize box breathing as needed for anxious moments. Client will work on boundary setting and organization with a goal of reducing overall anxiety.       "
